# Patient Record
Sex: Male | Born: 2005 | Race: White | Hispanic: No | Marital: Single | Smoking: Never smoker
Health system: Southern US, Community
[De-identification: ages and names within clinical notes are randomized; demographics above are authoritative.]

## PROBLEM LIST (undated history)

## (undated) DIAGNOSIS — R519 Headache, unspecified: Secondary | ICD-10-CM

## (undated) DIAGNOSIS — Z8619 Personal history of other infectious and parasitic diseases: Secondary | ICD-10-CM

## (undated) DIAGNOSIS — F909 Attention-deficit hyperactivity disorder, unspecified type: Secondary | ICD-10-CM

## (undated) HISTORY — PX: OTHER SURGICAL HISTORY: SHX169

---
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
## 2023-08-05 ENCOUNTER — Encounter: Payer: Self-pay | Admitting: Neurology

## 2023-08-05 ENCOUNTER — Ambulatory Visit (INDEPENDENT_AMBULATORY_CARE_PROVIDER_SITE_OTHER): Payer: BC Managed Care – PPO | Admitting: Neurology

## 2023-08-05 VITALS — BP 124/76 | HR 76 | Ht 66.0 in | Wt 126.0 lb

## 2023-08-05 DIAGNOSIS — H5319 Other subjective visual disturbances: Secondary | ICD-10-CM

## 2023-08-05 DIAGNOSIS — G43109 Migraine with aura, not intractable, without status migrainosus: Secondary | ICD-10-CM | POA: Diagnosis not present

## 2023-08-05 DIAGNOSIS — F988 Other specified behavioral and emotional disorders with onset usually occurring in childhood and adolescence: Secondary | ICD-10-CM | POA: Diagnosis not present

## 2023-08-05 MED ORDER — SUMATRIPTAN SUCCINATE 50 MG PO TABS: 50.0000 mg | ORAL_TABLET | ORAL | 11 refills | Status: AC | PRN

## 2023-08-05 MED ORDER — TOPIRAMATE 50 MG PO TABS: ORAL_TABLET | ORAL | 11 refills | Status: DC

## 2023-08-05 NOTE — Progress Notes (Signed)
[facility]  PATIENT: Larry Thompson DOB: 2005/12/16  REFERRING DOCTOR OR PCP:  Benedetto Goad, MD; Bernadette Hoit, MD SOURCE: patient, notes from PCP  _________________________________   HISTORICAL  CHIEF COMPLAINT:  Chief Complaint  Patient presents with   New Patient (Initial Visit)    Pt in room 11, mom in room. New patient here for headaches. Reports headaches since middle school, worsened while in college. Pt stopped caffeine and ADHD medication was having headaches twice per week. Pt reports headaches once per month now.     HISTORY OF PRESENT ILLNESS:  I had the pleasure of seeing your patient, Larry Thompson, at [facility] for neurologic consultation regarding his headaches  He is a 17 yo man who has a history of migraine headaches. The frequency and intensity both worsened in hig school and in college.   HA's became more frequent on ADD stimulants and he stopped.   HA is better but ADD is much worse off his medications..  Caffeine can trigger a HA and he cut back over the summer.    Currently HA's occur a few time a month.   With a HA, he gets a visual aura lasting up to 30 minutes.  As the aura clears, the HA intensifies and last > 6 hours typically.   He has mild nausea nw but had frequent vomiting with migraines a few years back. .  He has photophobia and phonophobia   He used to take Advil with partial benefit but now takes sumatriptan. Within 30 minutes the HA is better but generally does not resolve until later in the day.  He has ADD.  He has some anxiety and depression.  He notes his pulse increases when he does schoolwork.   He is Glass blower/designer in [employer].          REVIEW OF SYSTEMS: Constitutional: No fevers, chills, sweats, or change in appetite Eyes: No visual changes, double vision, eye pain Ear, nose and throat: No hearing loss, ear pain, nasal congestion, sore throat Cardiovascular: No chest pain, palpitations Respiratory:  No  shortness of breath at rest or with exertion.   No wheezes GastrointestinaI: No nausea, vomiting, diarrhea, abdominal pain, fecal incontinence Genitourinary:  No dysuria, urinary retention or frequency.  No nocturia. Musculoskeletal:  No neck pain, back pain Integumentary: No rash, pruritus, skin lesions Neurological: as above Psychiatric: No depression at this time.  No anxiety Endocrine: No palpitations, diaphoresis, change in appetite, change in weigh or increased thirst Hematologic/Lymphatic:  No anemia, purpura, petechiae. Allergic/Immunologic: No itchy/runny eyes, nasal congestion, recent allergic reactions, rashes  ALLERGIES: No Known Allergies  HOME MEDICATIONS:  Current Outpatient Medications:    topiramate (TOPAMAX) 50 MG tablet, Take one or two at bedtime, Disp: 60 tablet, Rfl: 11   buPROPion (WELLBUTRIN XL) 300 MG 24 hr tablet, Take 300 mg by mouth daily. (Patient not taking: Reported on 08/05/2023), Disp: , Rfl:    dexmethylphenidate (FOCALIN XR) 5 MG 24 hr capsule, Take 5 mg by mouth daily as needed. (Patient not taking: Reported on 08/05/2023), Disp: , Rfl:    hydrOXYzine (ATARAX/VISTARIL) 25 MG tablet, Take 1 tablet (25 mg total) by mouth every 8 (eight) hours as needed for anxiety. (Patient not taking: Reported on 08/05/2023), Disp: 12 tablet, Rfl: 0   SUMAtriptan (IMITREX) 50 MG tablet, Take 1 tablet (50 mg total) by mouth every 2 (two) hours as needed., Disp: 10 tablet, Rfl: 11  PAST MEDICAL HISTORY: Past Medical History:  Diagnosis Date  ADHD    Generalized headaches    History of cold sores     PAST SURGICAL HISTORY: Past Surgical History:  Procedure Laterality Date   hand surgery     right left thumb sugery    FAMILY HISTORY: Family History  Problem Relation Age of Onset   Migraines Mother    Migraines Maternal Grandmother    Ovarian cancer Paternal Grandmother    Dementia Paternal Grandfather     SOCIAL HISTORY: Social History   Socioeconomic  History   Marital status: Single    Spouse name: Not on file   Number of children: Not on file   Years of education: Not on file   Highest education level: Some college, no degree  Occupational History   Not on file  Tobacco Use   Smoking status: Never   Smokeless tobacco: Not on file  Vaping Use   Vaping status: Never Used  Substance and Sexual Activity   Alcohol use: Not Currently    Comment: social drinking but no drinking now   Drug use: Never   Sexual activity: Not on file  Other Topics Concern   Not on file  Social History Narrative   Right handed    Wear glasses    Drinks no caffeine soda (6 cans per week)   Social Determinants of Health   Financial Resource Strain: Not on file  Food Insecurity: Not on file  Transportation Needs: Not on file  Physical Activity: Not on file  Stress: Not on file  Social Connections: Not on file  Intimate Partner Violence: Not on file       PHYSICAL EXAM  Vitals:   08/05/23 1015  BP: 124/76  Pulse: 76  Weight: 126 lb (57.2 kg)  Height: 5\' 6"  (1.676 m)    Body mass index is 20.34 kg/m.   General: The patient is well-developed and well-nourished and in no acute distress  HEENT:  Head is [place]/AT.  Sclera are anicteric.  Funduscopic exam shows normal optic discs and retinal vessels.  Neck: No carotid bruits are noted.  The neck is nontender.  Cardiovascular: The heart has a regular rate and rhythm with a normal S1 and S2. There were no murmurs, gallops or rubs.    Skin: Extremities are without rash or  edema.  Musculoskeletal:  Back is nontender  Neurologic Exam  Mental status: The patient is alert and oriented x 3 at the time of the examination. The patient has apparent normal recent and remote memory, with an apparently normal attention span and concentration ability.   Speech is normal.  Cranial nerves: Extraocular movements are full. Pupils are equal, round, and reactive to light and accomodation.  There is good  facial sensation to soft touch bilaterally.Facial strength is normal.  Trapezius and sternocleidomastoid strength is normal. No dysarthria is noted.  The tongue is midline, and the patient has symmetric elevation of the soft palate. No obvious hearing deficits are noted.  Motor:  Muscle bulk is normal.   Tone is normal. Strength is  5 / 5 in all 4 extremities.   Sensory: Sensory testing is intact to pinprick, soft touch and vibration sensation in all 4 extremities.  Coordination: Cerebellar testing reveals good finger-nose-finger and heel-to-shin bilaterally.  Gait and station: Station is normal.   Gait is normal. Tandem gait is normal. Romberg is negative.   Reflexes: Deep tendon reflexes are symmetric and normal bilaterally.   Plantar responses are flexor.     ASSESSMENT AND PLAN  Migraine with aura and without status migrainosus, not intractable - Plan: CT HEAD WO CONTRAST ( )  Visual distortion - Plan: CT HEAD WO CONTRAST ( )  Attention deficit disorder, unspecified type   In summary, Mr. Wieck is a 17 year old man with a history of migraine headaches for about 7 years.  Migraines seem to worsen in intensity and frequency when placed on stimulant medication for ADD.  He is off those medications but may need to go back on as ADD issues persist.  For now, I will add topiramate 50 mg at night.  He may double the dose to 100 mg if needed.  Additionally I sent in a prescription for additional sumatriptan which has been beneficial.  If headache frequency does not improve, consider one of the anti-CGRP agents.   Due to visual disturbances associated with her HA lasting longer than typical aura, we will also check a CT scan of the head.    He will return to see me as needed based on the results of the studies and his response to medication.  Thank you for asking me to see Mr. Ballow.  Please let me know if I can be of further assistance with him or other patients in the  future.   Milka Windholz A. Epimenio Foot, MD, [facility] 08/05/2023, 12:53 PM Certified in Neurology, Clinical Neurophysiology, Sleep Medicine and Neuroimaging  [facility] [address] [place], [place] [address] [phone]

## 2023-08-06 ENCOUNTER — Telehealth: Payer: Self-pay | Admitting: Neurology

## 2023-08-06 NOTE — Telephone Encounter (Signed)
[facility] Numbers: [id number] exp. 08/06/23-09/04/23 sent to GI [phone]

## 2023-08-26 ENCOUNTER — Ambulatory Visit
Admission: RE | Admit: 2023-08-26 | Discharge: 2023-08-26 | Disposition: A | Discharge status: Home / Self Care | Payer: BC Managed Care – PPO | Source: Ambulatory Visit | Attending: Neurology | Admitting: Neurology

## 2023-08-26 DIAGNOSIS — H5319 Other subjective visual disturbances: Secondary | ICD-10-CM

## 2023-08-26 DIAGNOSIS — G43109 Migraine with aura, not intractable, without status migrainosus: Secondary | ICD-10-CM

## 2024-03-20 ENCOUNTER — Encounter: Payer: Self-pay | Admitting: Neurology

## 2024-03-23 ENCOUNTER — Other Ambulatory Visit: Payer: Self-pay | Admitting: Neurology

## 2024-03-23 MED ORDER — PROPRANOLOL HCL ER 60 MG PO CP24: 60.0000 mg | ORAL_CAPSULE | Freq: Every day | ORAL | 11 refills | Status: AC

## 2024-09-30 ENCOUNTER — Other Ambulatory Visit: Payer: Self-pay
